# Patient Record
Sex: Female | Born: 1955
Health system: Southern US, Community
[De-identification: ages and names within clinical notes are randomized; demographics above are authoritative.]

## PROBLEM LIST (undated history)

## (undated) DIAGNOSIS — Z789 Other specified health status: Secondary | ICD-10-CM

## (undated) HISTORY — PX: APPENDECTOMY: SHX54

## (undated) HISTORY — PX: CARPAL TUNNEL RELEASE: SHX101

---

## 2002-08-28 ENCOUNTER — Encounter: Admission: RE | Admit: 2002-08-28 | Discharge: 2002-08-28 | Payer: Self-pay | Admitting: Family Medicine

## 2002-08-28 ENCOUNTER — Encounter: Payer: Self-pay | Admitting: Family Medicine

## 2003-09-25 ENCOUNTER — Encounter: Admission: RE | Admit: 2003-09-25 | Discharge: 2003-09-25 | Payer: Self-pay | Admitting: Family Medicine

## 2004-08-12 ENCOUNTER — Other Ambulatory Visit: Admission: RE | Admit: 2004-08-12 | Discharge: 2004-08-12 | Payer: Self-pay | Admitting: Family Medicine

## 2005-05-19 ENCOUNTER — Encounter: Admission: RE | Admit: 2005-05-19 | Discharge: 2005-05-19 | Payer: Self-pay | Admitting: Family Medicine

## 2007-12-07 ENCOUNTER — Encounter: Admission: RE | Admit: 2007-12-07 | Discharge: 2007-12-07 | Payer: Self-pay | Admitting: Family Medicine

## 2009-02-14 ENCOUNTER — Encounter: Admission: RE | Admit: 2009-02-14 | Discharge: 2009-02-14 | Payer: Self-pay | Admitting: Family Medicine

## 2010-02-24 ENCOUNTER — Other Ambulatory Visit: Admission: RE | Admit: 2010-02-24 | Discharge: 2010-02-24 | Payer: Self-pay | Admitting: Family Medicine

## 2011-03-10 ENCOUNTER — Other Ambulatory Visit (HOSPITAL_COMMUNITY)
Admission: RE | Admit: 2011-03-10 | Discharge: 2011-03-10 | Disposition: A | Discharge status: Home / Self Care | Payer: 59 | Source: Ambulatory Visit | Attending: Family Medicine | Admitting: Family Medicine

## 2011-03-10 ENCOUNTER — Other Ambulatory Visit: Payer: Self-pay | Admitting: Family Medicine

## 2011-03-10 DIAGNOSIS — Z124 Encounter for screening for malignant neoplasm of cervix: Secondary | ICD-10-CM | POA: Insufficient documentation

## 2011-04-30 ENCOUNTER — Other Ambulatory Visit: Payer: Self-pay | Admitting: Gastroenterology

## 2011-05-24 ENCOUNTER — Emergency Department (HOSPITAL_COMMUNITY): Payer: 59

## 2011-05-24 ENCOUNTER — Ambulatory Visit (HOSPITAL_COMMUNITY)
Admission: EM | Admit: 2011-05-24 | Discharge: 2011-05-25 | Disposition: A | Discharge status: Home / Self Care | Payer: 59 | Attending: General Surgery | Admitting: General Surgery

## 2011-05-24 DIAGNOSIS — K352 Acute appendicitis with generalized peritonitis, without abscess: Secondary | ICD-10-CM | POA: Insufficient documentation

## 2011-05-24 DIAGNOSIS — R112 Nausea with vomiting, unspecified: Secondary | ICD-10-CM | POA: Insufficient documentation

## 2011-05-24 DIAGNOSIS — R1031 Right lower quadrant pain: Secondary | ICD-10-CM | POA: Insufficient documentation

## 2011-05-24 DIAGNOSIS — Z01810 Encounter for preprocedural cardiovascular examination: Secondary | ICD-10-CM | POA: Insufficient documentation

## 2011-05-24 DIAGNOSIS — N200 Calculus of kidney: Secondary | ICD-10-CM | POA: Insufficient documentation

## 2011-05-24 DIAGNOSIS — D72829 Elevated white blood cell count, unspecified: Secondary | ICD-10-CM | POA: Insufficient documentation

## 2011-05-24 DIAGNOSIS — K35209 Acute appendicitis with generalized peritonitis, without abscess, unspecified as to perforation: Secondary | ICD-10-CM | POA: Insufficient documentation

## 2011-05-24 LAB — URINALYSIS, ROUTINE W REFLEX MICROSCOPIC
Bilirubin Urine: NEGATIVE
Glucose, UA: NEGATIVE mg/dL
Ketones, ur: NEGATIVE mg/dL
Nitrite: NEGATIVE
Protein, ur: NEGATIVE mg/dL
Specific Gravity, Urine: 1.021 (ref 1.005–1.030)
Urobilinogen, UA: 0.2 mg/dL (ref 0.0–1.0)
pH: 6.5 (ref 5.0–8.0)

## 2011-05-24 LAB — POCT I-STAT, CHEM 8
BUN: 13 mg/dL (ref 6–23)
Calcium, Ion: 1.15 mmol/L (ref 1.12–1.32)
Chloride: 104 mEq/L (ref 96–112)
Creatinine, Ser: 0.8 mg/dL (ref 0.50–1.10)
Glucose, Bld: 105 mg/dL — ABNORMAL HIGH (ref 70–99)
HCT: 40 % (ref 36.0–46.0)
Hemoglobin: 13.6 g/dL (ref 12.0–15.0)
Potassium: 4.1 mEq/L (ref 3.5–5.1)
Sodium: 137 mEq/L (ref 135–145)
TCO2: 23 mmol/L (ref 0–100)

## 2011-05-24 LAB — URINE MICROSCOPIC-ADD ON

## 2011-05-25 ENCOUNTER — Encounter (HOSPITAL_COMMUNITY): Payer: Self-pay

## 2011-05-25 ENCOUNTER — Other Ambulatory Visit (HOSPITAL_COMMUNITY): Payer: Self-pay

## 2011-05-25 ENCOUNTER — Other Ambulatory Visit (INDEPENDENT_AMBULATORY_CARE_PROVIDER_SITE_OTHER): Payer: Self-pay | Admitting: General Surgery

## 2011-05-25 DIAGNOSIS — K3580 Unspecified acute appendicitis: Secondary | ICD-10-CM

## 2011-05-25 DIAGNOSIS — R112 Nausea with vomiting, unspecified: Secondary | ICD-10-CM

## 2011-05-25 DIAGNOSIS — R1031 Right lower quadrant pain: Secondary | ICD-10-CM

## 2011-05-25 MED ORDER — IOHEXOL 300 MG/ML  SOLN
100.0000 mL | Freq: Once | INTRAMUSCULAR | Status: AC | PRN
  Administered 2011-05-25: 100 mL via INTRAVENOUS

## 2011-05-25 NOTE — Op Note (Signed)
NAMECRYSTLE, Kimberly Mason                ACCOUNT NO.:  [id number]  MEDICAL RECORD NO.:  [id number]  LOCATION:  WLED                         FACILITY:  [facility]  PHYSICIAN:  Angelia Mould. Derrell Lolling, M.D.DATE OF BIRTH:  1956/01/01  DATE OF PROCEDURE:  05/25/2011 DATE OF DISCHARGE:                              OPERATIVE REPORT   PREOPERATIVE DIAGNOSIS:  Acute appendicitis  POSTOPERATIVE DIAGNOSIS:  Acute suppurative appendicitis with microperforation.  OPERATION PERFORMED:  Laparoscopic appendectomy.  SURGEON:  Angelia Mould. Derrell Lolling, M.D.  OPERATIVE INDICATIONS:  This is a 55 year old Caucasian female in good general health.  She presented with a 24-hour history of abdominal pain, nausea and vomiting.  The pain was mostly localized to the right lower quadrant, although she was tender in the left lower quadrant.  CT scan showed inflamed appendix but no abscess or free fluid.  She is brought to operating room emergently.  OPERATIVE FINDINGS:  The patient had acute suppurative appendicitis.  It looked like there was one small microperforation and one tiny fleck of stool right next to the appendix.  There was some cloudy fluid in the pelvis but she did not have diffuse peritonitis.  The uterus, ovaries, rectum, sigmoid colon, terminal ileum, right colon and liver all looked normal otherwise.  OPERATIVE TECHNIQUE:  Following the induction of general endotracheal anesthesia, Foley catheter was inserted.  The patient's abdomen was prepped and draped in sterile fashion.  Surgical time-out was held identifying correct patient and correct procedure.  Intravenous antibiotics had been given within an hour of the surgery time.  A 0.5% Marcaine with epinephrine was used as local infiltration anesthetic.  A curved transverse incision was made at the superior rim of the umbilicus.  The fascia was incised in the midline.  The abdominal cavity was  entered under direct vision.  The 11-mm Hassan trocar was  inserted and secured with a pursestring suture with 0 Vicryl.  Pneumoperitoneum was created.  Video cam was inserted with visualization and findings as described above.  A 5-mm trocar was placed in the left lower quadrant and a 12-mm trocar placed in the suprapubic area.  We irrigated out the pelvis and right lower quadrant a little bit.  We identified the appendix and slowly teased it away from the terminal ileum to which it was acutely adherent.  We divided some of the lateral peritoneal attachments of the cecum which allowed [place] to mobilize the appendix and cecum medially.  We could then identify the base of the appendix and the appendiceal mesentery.  We divided the appendiceal mesentery and artery slowly using the harmonic scalpel.  We did this in several steps until we had completely skeletonized the appendix and can see its junction with the cecum.  We inserted an Endo-GIA stapler with a 2.5 mm staple load.  This was placed transversely across the base of the appendix, closed, held in place for 30 seconds and fired and removed.  This gave a good closure.  The appendix was placed in the specimen bag and removed. There were a few loose staples which were removed.  We checked the staple line several times and it looked very good.  There was no bleeding.  We then spent some time irrigating out the pelvis, right paracolic gutter and subphrenic space to make sure all irrigation fluid became completely clear.  There did not appear to be any exudate or purulent material and so we completed the operation.  The trocars were removed under direct vision.  There was no bleeding from trocar sites. The pneumoperitoneum was released.  The fascia at the umbilicus was closed with 0 Vicryl sutures.  The skin incisions were closed with subcuticular sutures of 4-0 Monocryl and Dermabond.  The patient was taken to the recovery room in stable condition.  Estimated blood loss was no more than 10 to 15  cc. Complications were none.  Sponge, needle and instrument counts were correct.     Angelia Mould. Derrell Lolling, M.D.     HMI/MEDQ  D:  05/25/2011  T:  05/25/2011  Job:  [id number]  cc:   L. Lupe Carney, M.D. Fax: [phone]  Electronically Signed by Claud Kelp M.D. on 05/25/2011 12:39:20 PM

## 2011-05-25 NOTE — H&P (Signed)
Kimberly Mason, LAINO                ACCOUNT NO.:  [id number]  MEDICAL RECORD NO.:  [id number]  LOCATION:  1517                         FACILITY:  [facility]  PHYSICIAN:  Angelia Mould. Kimberly Mason, M.D.DATE OF BIRTH:  02-Jan-1956  DATE OF ADMISSION:  05/24/2011 DATE OF DISCHARGE:                             HISTORY & PHYSICAL   REFERRING PHYSICIAN:  Juliet Rude. [name], MD  CHIEF COMPLAINT:  Abdominal pain and vomiting.  HISTORY OF PRESENT ILLNESS:  This is a healthy, 55 year old Caucasian female, referred to me by Dr. Benjiman Core in the [facility] Emergency Department.  She is presumed to have appendicitis.  The patient was well until Sunday night, June 24th, and she noted the onset of diffuse abdominal pain.  This lasted all night and kept her from sleeping.  She denied any nausea or vomiting or diarrhea at that night.  She went to work Monday morning.  The pain got worse and she went to Urgent Care on [address] and was then sent to the emergency room.  She said the pain is more intense now and is down the right lower quadrant.  She has been nauseated and has vomited 2 or 3 times since. She denies any diarrhea, fever, chills, or prior similar episodes.  The CT scan strongly suggests acute appendicitis with dilated, inflamed, thickened appendix, but no other abnormalities.  PAST HISTORY:  Cesarean section x2, lower midline incision, right carpal tunnel release.  CURRENT MEDICATIONS:  Vitamins.  DRUG ALLERGIES:  None known.  FAMILY HISTORY:  Mother living, has been treated for breast cancer, has hypertension.  Father deceased, had prostate cancer, but died of pneumonia, has complications of Guillain-Barre syndrome.  She had 3 sisters, one died of a car wreck, and one has lost her gallbladder and appendix, but otherwise healthy.  SOCIAL HISTORY:  She is married and they have 2 children.  She works as Engineer, site at [employer].  Her husband is with her tonight.   She smokes one-fourth pack of cigarettes per day.  She drinks 1-2 alcoholic beverages a day.  REVIEW OF SYSTEMS:  Ten 10 system review of systems is performed and is noncontributory except as described above.  PHYSICAL EXAMINATION:  GENERAL:  A healthy middle-aged woman, in only mild distress.  Cooperative and pleasant. VITAL SIGNS:  Initial temperature 101.2, heart rate 95, blood pressure 122/76, respiratory rate 16, oxygen saturation 98%. HEENT:  Eyes, sclerae clear.  Extraocular movements intact.  Ears, nose, mouth, throat, nose, lips, tongue, and oropharynx are without gross lesions. NECK:  Supple, nontender.  No mass.  No jugular venous distention. LUNGS:  Clear to auscultation.  No chest wall tenderness. HEART:  Regular rate and rhythm.  No murmur.  Radial, femoral, and dorsalis pedis pulses are palpable. BREASTS:  Not examined. ABDOMEN:  Soft and nondistended with the exception of the right lower quadrant where she is extremely tender with involuntary guarding, percussion tenderness and direct rebound.  She is slightly tender in the left lower quadrant.  Upper abdomen is soft.  The umbilicus looks normal.  There is a lower midline scar.  No hernias in the scar.  I do not really feel any masses. GENITOURINARY:  There is no inguinal hernia or mass detected when she is supine. EXTREMITIES:  She moves all 4 extremities well without pain or deformity. NEUROLOGIC:  No gross motor sensory deficits.  DATA REVIEWED:  CT scan as described above, strongly suggesting acute appendicitis.  Urinalysis has a negative microscopic.  Hemoglobin 13.6, potassium 4.1, glucose 105, creatinine 0.8.  ASSESSMENT: 1. Acute appendicitis.  No evidence for abscess. 2. Status post cesarean section x2, lower midline incision.  PLAN:  The patient has already been started on intravenous antibiotics and will be taken to operating room shortly for diagnostic laparoscopy and laparoscopic appendectomy.  I  have discussed the indication and details of surgery with the patient and her husband.  Risks and complications have been outlined, including not limited to bleeding, infection, conversion to open laparotomy, injury to adjacent organs such as the ureter, intestine, or bladder with major reconstructive surgery, wound healing problems such as hernia or infection, cardiac, pulmonary, and thromboembolic problems.  She seems to understand these issues well.  At this time, all of her questions are answered.  She is in full agreement with this plan.     Angelia Mould. Kimberly Mason, M.D.     HMI/MEDQ  D:  05/25/2011  T:  05/25/2011  Job:  [id number]  cc:   Dr. Particia Jasper at [facility]  Electronically Signed by Claud Kelp M.D. on 05/25/2011 12:40:08 PM

## 2011-09-08 ENCOUNTER — Other Ambulatory Visit: Payer: Self-pay | Admitting: Family Medicine

## 2011-09-08 DIAGNOSIS — Z1231 Encounter for screening mammogram for malignant neoplasm of breast: Secondary | ICD-10-CM

## 2011-09-29 ENCOUNTER — Ambulatory Visit
Admission: RE | Admit: 2011-09-29 | Discharge: 2011-09-29 | Disposition: A | Discharge status: Home / Self Care | Payer: 59 | Source: Ambulatory Visit | Attending: Family Medicine | Admitting: Family Medicine

## 2011-09-29 DIAGNOSIS — Z1231 Encounter for screening mammogram for malignant neoplasm of breast: Secondary | ICD-10-CM

## 2012-09-05 ENCOUNTER — Other Ambulatory Visit: Payer: Self-pay | Admitting: Family Medicine

## 2012-09-05 DIAGNOSIS — Z1231 Encounter for screening mammogram for malignant neoplasm of breast: Secondary | ICD-10-CM

## 2012-10-03 ENCOUNTER — Ambulatory Visit
Admission: RE | Admit: 2012-10-03 | Discharge: 2012-10-03 | Disposition: A | Discharge status: Home / Self Care | Payer: 59 | Source: Ambulatory Visit | Attending: Family Medicine | Admitting: Family Medicine

## 2012-10-03 DIAGNOSIS — Z1231 Encounter for screening mammogram for malignant neoplasm of breast: Secondary | ICD-10-CM

## 2013-10-05 ENCOUNTER — Other Ambulatory Visit: Payer: Self-pay

## 2013-10-05 DIAGNOSIS — Z1231 Encounter for screening mammogram for malignant neoplasm of breast: Secondary | ICD-10-CM

## 2013-11-06 ENCOUNTER — Ambulatory Visit
Admission: RE | Admit: 2013-11-06 | Discharge: 2013-11-06 | Disposition: A | Discharge status: Home / Self Care | Payer: 59 | Source: Ambulatory Visit

## 2013-11-06 DIAGNOSIS — Z1231 Encounter for screening mammogram for malignant neoplasm of breast: Secondary | ICD-10-CM

## 2014-10-17 ENCOUNTER — Other Ambulatory Visit (HOSPITAL_COMMUNITY)
Admission: RE | Admit: 2014-10-17 | Discharge: 2014-10-17 | Disposition: A | Discharge status: Home / Self Care | Payer: 59 | Source: Ambulatory Visit | Attending: Obstetrics & Gynecology | Admitting: Obstetrics & Gynecology

## 2014-10-17 ENCOUNTER — Other Ambulatory Visit: Payer: Self-pay | Admitting: Obstetrics & Gynecology

## 2014-10-17 DIAGNOSIS — Z1151 Encounter for screening for human papillomavirus (HPV): Secondary | ICD-10-CM | POA: Diagnosis present

## 2014-10-17 DIAGNOSIS — Z01419 Encounter for gynecological examination (general) (routine) without abnormal findings: Secondary | ICD-10-CM | POA: Diagnosis present

## 2014-10-18 LAB — CYTOLOGY - PAP

## 2015-04-11 ENCOUNTER — Other Ambulatory Visit: Payer: Self-pay

## 2015-04-11 DIAGNOSIS — Z1231 Encounter for screening mammogram for malignant neoplasm of breast: Secondary | ICD-10-CM

## 2015-04-17 ENCOUNTER — Ambulatory Visit
Admission: RE | Admit: 2015-04-17 | Discharge: 2015-04-17 | Disposition: A | Discharge status: Home / Self Care | Payer: 59 | Source: Ambulatory Visit

## 2015-04-17 DIAGNOSIS — Z1231 Encounter for screening mammogram for malignant neoplasm of breast: Secondary | ICD-10-CM

## 2016-03-16 ENCOUNTER — Other Ambulatory Visit: Payer: Self-pay

## 2016-03-16 DIAGNOSIS — Z1231 Encounter for screening mammogram for malignant neoplasm of breast: Secondary | ICD-10-CM

## 2016-04-20 ENCOUNTER — Ambulatory Visit: Payer: 59

## 2016-05-14 ENCOUNTER — Ambulatory Visit
Admission: RE | Admit: 2016-05-14 | Discharge: 2016-05-14 | Disposition: A | Discharge status: Home / Self Care | Payer: 59 | Source: Ambulatory Visit

## 2016-05-14 DIAGNOSIS — Z1231 Encounter for screening mammogram for malignant neoplasm of breast: Secondary | ICD-10-CM

## 2017-07-22 DIAGNOSIS — Z1159 Encounter for screening for other viral diseases: Secondary | ICD-10-CM | POA: Diagnosis not present

## 2017-07-22 DIAGNOSIS — Z23 Encounter for immunization: Secondary | ICD-10-CM | POA: Diagnosis not present

## 2017-07-22 DIAGNOSIS — Z0000 Encounter for general adult medical examination without abnormal findings: Secondary | ICD-10-CM | POA: Diagnosis not present

## 2017-07-22 DIAGNOSIS — Z13220 Encounter for screening for lipoid disorders: Secondary | ICD-10-CM | POA: Diagnosis not present

## 2017-07-25 ENCOUNTER — Other Ambulatory Visit: Payer: Self-pay | Admitting: Family Medicine

## 2017-07-25 DIAGNOSIS — Z1231 Encounter for screening mammogram for malignant neoplasm of breast: Secondary | ICD-10-CM

## 2017-08-09 ENCOUNTER — Ambulatory Visit: Payer: 59

## 2017-08-11 ENCOUNTER — Ambulatory Visit
Admission: RE | Admit: 2017-08-11 | Discharge: 2017-08-11 | Disposition: A | Discharge status: Home / Self Care | Payer: 59 | Source: Ambulatory Visit | Attending: Family Medicine | Admitting: Family Medicine

## 2017-08-11 DIAGNOSIS — Z1231 Encounter for screening mammogram for malignant neoplasm of breast: Secondary | ICD-10-CM | POA: Diagnosis not present

## 2017-08-22 DIAGNOSIS — H40033 Anatomical narrow angle, bilateral: Secondary | ICD-10-CM | POA: Diagnosis not present

## 2017-08-22 DIAGNOSIS — H04123 Dry eye syndrome of bilateral lacrimal glands: Secondary | ICD-10-CM | POA: Diagnosis not present

## 2017-11-03 DIAGNOSIS — Z01411 Encounter for gynecological examination (general) (routine) with abnormal findings: Secondary | ICD-10-CM | POA: Diagnosis not present

## 2017-11-03 DIAGNOSIS — N952 Postmenopausal atrophic vaginitis: Secondary | ICD-10-CM | POA: Diagnosis not present

## 2017-11-25 DIAGNOSIS — R05 Cough: Secondary | ICD-10-CM | POA: Diagnosis not present

## 2017-11-25 DIAGNOSIS — J069 Acute upper respiratory infection, unspecified: Secondary | ICD-10-CM | POA: Diagnosis not present

## 2018-03-14 DIAGNOSIS — M79641 Pain in right hand: Secondary | ICD-10-CM | POA: Diagnosis not present

## 2018-03-14 DIAGNOSIS — G5603 Carpal tunnel syndrome, bilateral upper limbs: Secondary | ICD-10-CM | POA: Diagnosis not present

## 2018-03-14 DIAGNOSIS — M79642 Pain in left hand: Secondary | ICD-10-CM | POA: Diagnosis not present

## 2018-03-14 DIAGNOSIS — M542 Cervicalgia: Secondary | ICD-10-CM | POA: Diagnosis not present

## 2018-03-29 DIAGNOSIS — G5603 Carpal tunnel syndrome, bilateral upper limbs: Secondary | ICD-10-CM | POA: Diagnosis not present

## 2018-03-29 DIAGNOSIS — G5621 Lesion of ulnar nerve, right upper limb: Secondary | ICD-10-CM | POA: Diagnosis not present

## 2018-05-10 DIAGNOSIS — M5412 Radiculopathy, cervical region: Secondary | ICD-10-CM | POA: Diagnosis not present

## 2018-05-10 DIAGNOSIS — M542 Cervicalgia: Secondary | ICD-10-CM | POA: Diagnosis not present

## 2018-06-29 DIAGNOSIS — Z23 Encounter for immunization: Secondary | ICD-10-CM | POA: Diagnosis not present

## 2018-07-24 DIAGNOSIS — M25562 Pain in left knee: Secondary | ICD-10-CM | POA: Diagnosis not present

## 2018-07-24 DIAGNOSIS — M25561 Pain in right knee: Secondary | ICD-10-CM | POA: Diagnosis not present

## 2018-08-02 ENCOUNTER — Ambulatory Visit: Payer: 59 | Admitting: Podiatry

## 2019-01-04 ENCOUNTER — Other Ambulatory Visit: Payer: Self-pay | Admitting: Family Medicine

## 2019-01-04 DIAGNOSIS — Z1231 Encounter for screening mammogram for malignant neoplasm of breast: Secondary | ICD-10-CM

## 2019-01-05 ENCOUNTER — Ambulatory Visit
Admission: RE | Admit: 2019-01-05 | Discharge: 2019-01-05 | Disposition: A | Discharge status: Home / Self Care | Payer: 59 | Source: Ambulatory Visit | Attending: Family Medicine | Admitting: Family Medicine

## 2019-01-05 DIAGNOSIS — Z1231 Encounter for screening mammogram for malignant neoplasm of breast: Secondary | ICD-10-CM

## 2019-04-20 ENCOUNTER — Other Ambulatory Visit: Payer: Self-pay | Admitting: Orthopedic Surgery

## 2019-05-03 ENCOUNTER — Other Ambulatory Visit: Payer: Self-pay

## 2019-05-03 ENCOUNTER — Encounter (HOSPITAL_BASED_OUTPATIENT_CLINIC_OR_DEPARTMENT_OTHER): Payer: Self-pay | Admitting: *Deleted

## 2019-05-07 ENCOUNTER — Other Ambulatory Visit (HOSPITAL_COMMUNITY)
Admission: RE | Admit: 2019-05-07 | Discharge: 2019-05-07 | Disposition: A | Discharge status: Home / Self Care | Payer: 59 | Source: Ambulatory Visit | Attending: Orthopedic Surgery | Admitting: Orthopedic Surgery

## 2019-05-07 DIAGNOSIS — Z1159 Encounter for screening for other viral diseases: Secondary | ICD-10-CM | POA: Insufficient documentation

## 2019-05-07 NOTE — Progress Notes (Signed)
Patient given Ensure presurgery drink with instruction to drink by 0615. Pt voiced understanding without questions.

## 2019-05-08 LAB — NOVEL CORONAVIRUS, NAA (HOSP ORDER, SEND-OUT TO REF LAB; TAT 18-24 HRS): SARS-CoV-2, NAA: NOT DETECTED

## 2019-05-10 ENCOUNTER — Ambulatory Visit (HOSPITAL_BASED_OUTPATIENT_CLINIC_OR_DEPARTMENT_OTHER): Payer: 59 | Admitting: Anesthesiology

## 2019-05-10 ENCOUNTER — Encounter (HOSPITAL_BASED_OUTPATIENT_CLINIC_OR_DEPARTMENT_OTHER): Payer: Self-pay

## 2019-05-10 ENCOUNTER — Ambulatory Visit (HOSPITAL_BASED_OUTPATIENT_CLINIC_OR_DEPARTMENT_OTHER)
Admission: RE | Admit: 2019-05-10 | Discharge: 2019-05-10 | Disposition: A | Discharge status: Home / Self Care | Payer: 59 | Attending: Orthopedic Surgery | Admitting: Orthopedic Surgery

## 2019-05-10 ENCOUNTER — Encounter (HOSPITAL_BASED_OUTPATIENT_CLINIC_OR_DEPARTMENT_OTHER)
Admission: RE | Disposition: A | Discharge status: Home / Self Care | Payer: Self-pay | Source: Home / Self Care | Attending: Orthopedic Surgery

## 2019-05-10 ENCOUNTER — Other Ambulatory Visit: Payer: Self-pay

## 2019-05-10 DIAGNOSIS — G5602 Carpal tunnel syndrome, left upper limb: Secondary | ICD-10-CM | POA: Insufficient documentation

## 2019-05-10 HISTORY — DX: Other specified health status: Z78.9

## 2019-05-10 HISTORY — PX: CARPAL TUNNEL RELEASE: SHX101

## 2019-05-10 SURGERY — CARPAL TUNNEL RELEASE
Anesthesia: Regional | Site: Hand | Laterality: Left

## 2019-05-10 MED ORDER — SCOPOLAMINE 1 MG/3DAYS TD PT72: 1.0000 | MEDICATED_PATCH | Freq: Once | TRANSDERMAL | Status: DC | PRN

## 2019-05-10 MED ORDER — ONDANSETRON HCL 4 MG/2ML IJ SOLN: 4.0000 mg | Freq: Once | INTRAMUSCULAR | Status: DC | PRN

## 2019-05-10 MED ORDER — OXYCODONE HCL 5 MG PO TABS: 5.0000 mg | ORAL_TABLET | Freq: Once | ORAL | Status: DC | PRN

## 2019-05-10 MED ORDER — FENTANYL CITRATE (PF) 100 MCG/2ML IJ SOLN
50.0000 ug | INTRAMUSCULAR | Status: DC | PRN
  Administered 2019-05-10 (×2): 50 ug via INTRAVENOUS

## 2019-05-10 MED ORDER — PROPOFOL 10 MG/ML IV BOLUS
INTRAVENOUS | Status: DC | PRN
  Administered 2019-05-10: 20 mg via INTRAVENOUS

## 2019-05-10 MED ORDER — SUCCINYLCHOLINE CHLORIDE 200 MG/10ML IV SOSY
PREFILLED_SYRINGE | INTRAVENOUS | Status: AC
  Filled 2019-05-10: qty 10

## 2019-05-10 MED ORDER — PHENYLEPHRINE 40 MCG/ML (10ML) SYRINGE FOR IV PUSH (FOR BLOOD PRESSURE SUPPORT)
PREFILLED_SYRINGE | INTRAVENOUS | Status: AC
  Filled 2019-05-10: qty 10

## 2019-05-10 MED ORDER — MIDAZOLAM HCL 2 MG/2ML IJ SOLN
1.0000 mg | INTRAMUSCULAR | Status: DC | PRN
  Administered 2019-05-10 (×2): 1 mg via INTRAVENOUS

## 2019-05-10 MED ORDER — LACTATED RINGERS IV SOLN
INTRAVENOUS | Status: DC
  Administered 2019-05-10 (×2): via INTRAVENOUS

## 2019-05-10 MED ORDER — 0.9 % SODIUM CHLORIDE (POUR BTL) OPTIME
TOPICAL | Status: DC | PRN
  Administered 2019-05-10: 11:00:00 1000 mL

## 2019-05-10 MED ORDER — BUPIVACAINE HCL (PF) 0.25 % IJ SOLN
INTRAMUSCULAR | Status: DC | PRN
  Administered 2019-05-10: 10 mL

## 2019-05-10 MED ORDER — LIDOCAINE 2% (20 MG/ML) 5 ML SYRINGE
INTRAMUSCULAR | Status: AC
  Filled 2019-05-10: qty 5

## 2019-05-10 MED ORDER — CEFAZOLIN SODIUM-DEXTROSE 2-4 GM/100ML-% IV SOLN
2.0000 g | INTRAVENOUS | Status: AC
  Administered 2019-05-10: 2 g via INTRAVENOUS

## 2019-05-10 MED ORDER — ONDANSETRON HCL 4 MG/2ML IJ SOLN
INTRAMUSCULAR | Status: AC
  Filled 2019-05-10: qty 2

## 2019-05-10 MED ORDER — FENTANYL CITRATE (PF) 100 MCG/2ML IJ SOLN
INTRAMUSCULAR | Status: AC
  Filled 2019-05-10: qty 2

## 2019-05-10 MED ORDER — MIDAZOLAM HCL 2 MG/2ML IJ SOLN
INTRAMUSCULAR | Status: AC
  Filled 2019-05-10: qty 2

## 2019-05-10 MED ORDER — LIDOCAINE HCL (PF) 0.5 % IJ SOLN
INTRAMUSCULAR | Status: DC | PRN
  Administered 2019-05-10: 30 mL via INTRAVENOUS

## 2019-05-10 MED ORDER — CHLORHEXIDINE GLUCONATE 4 % EX LIQD: 60.0000 mL | Freq: Once | CUTANEOUS | Status: DC

## 2019-05-10 MED ORDER — FENTANYL CITRATE (PF) 100 MCG/2ML IJ SOLN: 25.0000 ug | INTRAMUSCULAR | Status: DC | PRN

## 2019-05-10 MED ORDER — CEFAZOLIN SODIUM-DEXTROSE 2-4 GM/100ML-% IV SOLN
INTRAVENOUS | Status: AC
  Filled 2019-05-10: qty 100

## 2019-05-10 MED ORDER — HYDROCODONE-ACETAMINOPHEN 5-325 MG PO TABS: ORAL_TABLET | ORAL | 0 refills | Status: AC

## 2019-05-10 MED ORDER — ONDANSETRON HCL 4 MG/2ML IJ SOLN
INTRAMUSCULAR | Status: DC | PRN
  Administered 2019-05-10: 4 mg via INTRAVENOUS

## 2019-05-10 MED ORDER — OXYCODONE HCL 5 MG/5ML PO SOLN: 5.0000 mg | Freq: Once | ORAL | Status: DC | PRN

## 2019-05-10 MED ORDER — EPHEDRINE 5 MG/ML INJ
INTRAVENOUS | Status: AC
  Filled 2019-05-10: qty 10

## 2019-05-10 SURGICAL SUPPLY — 37 items
APL PRP STRL LF DISP 70% ISPRP (MISCELLANEOUS) ×1
BANDAGE ACE 3X5.8 VEL STRL LF (GAUZE/BANDAGES/DRESSINGS) ×2 IMPLANT
BLADE SURG 15 STRL LF DISP TIS (BLADE) ×2 IMPLANT
BLADE SURG 15 STRL SS (BLADE) ×4
BNDG CMPR 9X4 STRL LF SNTH (GAUZE/BANDAGES/DRESSINGS) ×1
BNDG ELASTIC 2X5.8 VLCR STR LF (GAUZE/BANDAGES/DRESSINGS) ×1 IMPLANT
BNDG ESMARK 4X9 LF (GAUZE/BANDAGES/DRESSINGS) ×1 IMPLANT
BNDG GAUZE ELAST 4 BULKY (GAUZE/BANDAGES/DRESSINGS) ×2 IMPLANT
CHLORAPREP W/TINT 26 (MISCELLANEOUS) ×2 IMPLANT
CORD BIPOLAR FORCEPS 12FT (ELECTRODE) ×2 IMPLANT
COVER BACK TABLE REUSABLE LG (DRAPES) ×2 IMPLANT
COVER MAYO STAND REUSABLE (DRAPES) ×2 IMPLANT
COVER WAND RF STERILE (DRAPES) IMPLANT
CUFF TOURN SGL QUICK 18X4 (TOURNIQUET CUFF) ×2 IMPLANT
DRAPE EXTREMITY T 121X128X90 (DISPOSABLE) ×2 IMPLANT
DRAPE SURG 17X23 STRL (DRAPES) ×2 IMPLANT
DRSG PAD ABDOMINAL 8X10 ST (GAUZE/BANDAGES/DRESSINGS) ×2 IMPLANT
GAUZE SPONGE 4X4 12PLY STRL (GAUZE/BANDAGES/DRESSINGS) ×2 IMPLANT
GAUZE XEROFORM 1X8 LF (GAUZE/BANDAGES/DRESSINGS) ×2 IMPLANT
GLOVE BIO SURGEON STRL SZ7.5 (GLOVE) ×2 IMPLANT
GLOVE BIOGEL PI IND STRL 8 (GLOVE) ×1 IMPLANT
GLOVE BIOGEL PI INDICATOR 8 (GLOVE) ×1
GOWN STRL REUS W/ TWL LRG LVL3 (GOWN DISPOSABLE) ×1 IMPLANT
GOWN STRL REUS W/TWL LRG LVL3 (GOWN DISPOSABLE) ×2
GOWN STRL REUS W/TWL XL LVL3 (GOWN DISPOSABLE) ×2 IMPLANT
NDL HYPO 25X1 1.5 SAFETY (NEEDLE) ×1 IMPLANT
NEEDLE HYPO 25X1 1.5 SAFETY (NEEDLE) ×2 IMPLANT
NS IRRIG 1000ML POUR BTL (IV SOLUTION) ×2 IMPLANT
PACK BASIN DAY SURGERY FS (CUSTOM PROCEDURE TRAY) ×2 IMPLANT
PADDING CAST ABS 4INX4YD NS (CAST SUPPLIES) ×1
PADDING CAST ABS COTTON 4X4 ST (CAST SUPPLIES) ×1 IMPLANT
STOCKINETTE 4X48 STRL (DRAPES) ×2 IMPLANT
SUT ETHILON 4 0 PS 2 18 (SUTURE) ×2 IMPLANT
SYR BULB 3OZ (MISCELLANEOUS) ×2 IMPLANT
SYR CONTROL 10ML LL (SYRINGE) ×2 IMPLANT
TOWEL GREEN STERILE FF (TOWEL DISPOSABLE) ×4 IMPLANT
UNDERPAD 30X30 (UNDERPADS AND DIAPERS) ×2 IMPLANT

## 2019-05-10 NOTE — Discharge Instructions (Addendum)

## 2019-05-10 NOTE — Anesthesia Postprocedure Evaluation (Signed)
Anesthesia Post Note  Patient: Addilyn Satterwhite  Procedure(s) Performed: CARPAL TUNNEL RELEASE LEFT (Left Hand)     Patient location during evaluation: PACU Anesthesia Type: Bier Block Level of consciousness: awake and alert Pain management: pain level controlled Vital Signs Assessment: post-procedure vital signs reviewed and stable Respiratory status: spontaneous breathing, nonlabored ventilation and respiratory function stable Cardiovascular status: blood pressure returned to baseline and stable Postop Assessment: no apparent nausea or vomiting Anesthetic complications: no    Last Vitals:  Vitals:   05/10/19 1130 05/10/19 1148  BP: (!) 162/85 (!) 155/81  Pulse:  (!) 42  Resp:  16  Temp:  36.4 C  SpO2:  100%    Last Pain:  Vitals:   05/10/19 1148  TempSrc: Oral  PainSc: 0-No pain                 Lidia Collum

## 2019-05-10 NOTE — H&P (Signed)
Kimberly Mason is an 63 y.o. female.   Chief Complaint: left carpal tunnel syndrome HPI: 63 yo female with numbness and tingling left hand.  Positive nerve conduction studies.  Some relief with injection.  She wishes to have a carpal tunnel release.  Allergies: No Known Allergies  Past Medical History:  Diagnosis Date  . Medical history non-contributory     Past Surgical History:  Procedure Laterality Date  . APPENDECTOMY    . CARPAL TUNNEL RELEASE Right   . CESAREAN SECTION      Family History: Family History  Problem Relation Age of Onset  . Breast cancer Mother   . Breast cancer Maternal Grandmother     Social History:   reports that she has never smoked. She has never used smokeless tobacco. She reports that she does not use drugs. No history on file for alcohol.  Medications: Medications Prior to Admission  Medication Sig Dispense Refill  . glucosamine-chondroitin 500-400 MG tablet Take 1 tablet by mouth 3 (three) times daily.    . Multiple Vitamin (MULTIVITAMIN) tablet Take 1 tablet by mouth daily.    . Omega-3 Fatty Acids (FISH OIL) 1200 MG CAPS Take 2,400 mg by mouth.      No results found for this or any previous visit (from the past 48 hour(s)).  No results found.   A comprehensive review of systems was negative.  Height 5\' 4"  (1.626 m), weight 68 kg.  General appearance: alert, cooperative and appears stated age Head: Normocephalic, without obvious abnormality, atraumatic Neck: supple, symmetrical, trachea midline Cardio: regular rate and rhythm Resp: clear to auscultation bilaterally Extremities: Intact sensation and capillary refill all digits.  +epl/fpl/io.  No wounds.  Pulses: 2+ and symmetric Skin: Skin color, texture, turgor normal. No rashes or lesions Neurologic: Grossly normal Incision/Wound: none  Assessment/Plan Left carpal tunnel syndrome.  Non operative and operative treatment options have been discussed with the patient and patient  wishes to proceed with operative treatment. Risks, benefits, and alternatives of surgery have been discussed and the patient agrees with the plan of care.   Leanora Cover 05/10/2019, 8:42 AM

## 2019-05-10 NOTE — Anesthesia Preprocedure Evaluation (Addendum)
Anesthesia Evaluation  Patient identified by MRN, date of birth, ID band Patient awake    Reviewed: Allergy & Precautions, NPO status , Patient's Chart, lab work & pertinent test results  History of Anesthesia Complications Negative for: history of anesthetic complications  Airway Mallampati: II  TM Distance: >3 FB Neck ROM: Full    Dental   Pulmonary neg pulmonary ROS,    Pulmonary exam normal        Cardiovascular negative cardio ROS Normal cardiovascular exam     Neuro/Psych negative neurological ROS  negative psych ROS   GI/Hepatic negative GI ROS, Neg liver ROS,   Endo/Other  negative endocrine ROS  Renal/GU negative Renal ROS  negative genitourinary   Musculoskeletal negative musculoskeletal ROS (+)   Abdominal   Peds  Hematology negative hematology ROS (+)   Anesthesia Other Findings   Reproductive/Obstetrics                            Anesthesia Physical Anesthesia Plan  ASA: I  Anesthesia Plan: Bier Block and Bier Block-LIDOCAINE ONLY   Post-op Pain Management:    Induction:   PONV Risk Score and Plan: 2 and Ondansetron, Propofol infusion and Treatment may vary due to age or medical condition  Airway Management Planned: Natural Airway, Nasal Cannula and Simple Face Mask  Additional Equipment: None  Intra-op Plan:   Post-operative Plan:   Informed Consent: I have reviewed the patients History and Physical, chart, labs and discussed the procedure including the risks, benefits and alternatives for the proposed anesthesia with the patient or authorized representative who has indicated his/her understanding and acceptance.       Plan Discussed with:   Anesthesia Plan Comments:        Anesthesia Quick Evaluation

## 2019-05-10 NOTE — Op Note (Signed)
05/10/2019 [facility]                              OPERATIVE REPORT   PREOPERATIVE DIAGNOSIS:  Left carpal tunnel syndrome.  POSTOPERATIVE DIAGNOSIS:  Left carpal tunnel syndrome.  PROCEDURE:  Left carpal tunnel release.  SURGEON:  Betha LoaKevin Lamark Schue, MD  ASSISTANT:  none.  ANESTHESIA: Bier block with sedation  IV FLUIDS:  Per anesthesia flow sheet.  ESTIMATED BLOOD LOSS:  Minimal.  COMPLICATIONS:  None.  SPECIMENS:  None.  TOURNIQUET TIME:    Total Tourniquet Time Documented: Forearm (Left) - 39 minutes Total: Forearm (Left) - 39 minutes   DISPOSITION:  Stable to PACU.  LOCATION: [facility]  INDICATIONS:  63 yo female with numbness and tingling left hand.  Positive nerve conduction studies.  She wishes to have a carpal tunnel release for management of her symptoms.  Risks, benefits and alternatives of surgery were discussed including the risk of blood loss; infection; damage to nerves, vessels, tendons, ligaments, bone; failure of surgery; need for additional surgery; complications with wound healing; continued pain; recurrence of carpal tunnel syndrome; and damage to motor branch. She voiced understanding of these risks and elected to proceed.   OPERATIVE COURSE:  After being identified preoperatively by myself, the patient and I agreed upon the procedure and site of procedure.  The surgical site was marked.  The risks, benefits, and alternatives of the surgery were reviewed and she wished to proceed.  Surgical consent had been signed.  She was given IV Ancef as preoperative antibiotic prophylaxis.  She was transferred to the operating room and placed on the operating room table in supine position with the Left upper extremity on an armboard.  Bier block anesthesia was induced by the anesthesiologist.  Left upper extremity was prepped and draped in normal sterile orthopaedic fashion.  A surgical pause was performed between the surgeons, anesthesia, and  operating room staff, and all were in agreement as to the patient, procedure, and site of procedure.  Tourniquet at the proximal aspect of the forearm had been inflated for the Bier block  Incision was made over the transverse carpal ligament and carried into the subcutaneous tissues by spreading technique.  Bipolar electrocautery was used to obtain hemostasis.  The palmar fascia was sharply incised.  The median nerve was initially retracted ulnarly and then radially.  There was an accessory branch of nerve noted.  The transverse carpal ligament was identified and sharply incised.  It was incised distally first.  Care was taken to ensure complete decompression distally.  It was then incised proximally.  The distal forearm fascia was tight and visualization was inadequate.  The incision was extended proximally to allow better visualization and prevent nerve damage.  Scissors were used to split the distal aspect of the volar antebrachial fascia.   The nerve was examined.  It was flattened and hyperemic. and It was adherent to the radial leaflet.  The motor branch was identified and was intact. The common digital branches of the nerve were identified and were intact. The accessory nerve branch appeared to be a communication from the ulnar nerve to the median nerve and was intact.  The wound was copiously irrigated with sterile saline.  It was then closed with 4-0 nylon in a horizontal mattress fashion.  It was injected with 0.25% plain Marcaine to aid in postoperative analgesia.  It was dressed with sterile Xeroform, 4x4s,  an ABD, and wrapped with Kerlix and an Ace bandage.  Tourniquet was deflated at 39 minutes.  Fingertips were pink with brisk capillary refill after deflation of the tourniquet.  Operative drapes were broken down.  The patient was awoken from anesthesia safely.  She was transferred back to stretcher and taken to the PACU in stable condition.  I will see her back in the office in 1 week for  postoperative followup.  I will give her a prescription for Norco 5/325 1-2 tabs PO q6 hours prn pain, dispense # 20.    Leanora Cover, MD Electronically signed, 05/10/19

## 2019-05-10 NOTE — Transfer of Care (Signed)
Immediate Anesthesia Transfer of Care Note  Patient: Kimberly Mason  Procedure(s) Performed: CARPAL TUNNEL RELEASE LEFT (Left Hand)  Patient Location: PACU  Anesthesia Type:MAC and Bier block  Level of Consciousness: awake, alert  and oriented  Airway & Oxygen Therapy: Patient Spontanous Breathing and Patient connected to face mask oxygen  Post-op Assessment: Report given to RN and Post -op Vital signs reviewed and stable  Post vital signs: Reviewed and stable  Last Vitals:  Vitals Value Taken Time  BP    Temp    Pulse    Resp    SpO2      Last Pain:  Vitals:   05/10/19 0856  TempSrc: Oral  PainSc: 0-No pain         Complications: No apparent anesthesia complications

## 2019-05-11 ENCOUNTER — Encounter (HOSPITAL_BASED_OUTPATIENT_CLINIC_OR_DEPARTMENT_OTHER): Payer: Self-pay | Admitting: Orthopedic Surgery

## 2020-04-18 ENCOUNTER — Other Ambulatory Visit: Payer: Self-pay | Admitting: Family Medicine

## 2020-04-18 DIAGNOSIS — Z1231 Encounter for screening mammogram for malignant neoplasm of breast: Secondary | ICD-10-CM

## 2020-04-22 ENCOUNTER — Ambulatory Visit
Admission: RE | Admit: 2020-04-22 | Discharge: 2020-04-22 | Disposition: A | Discharge status: Home / Self Care | Payer: 59 | Source: Ambulatory Visit | Attending: Family Medicine | Admitting: Family Medicine

## 2020-04-22 ENCOUNTER — Other Ambulatory Visit: Payer: Self-pay

## 2020-04-22 DIAGNOSIS — Z1231 Encounter for screening mammogram for malignant neoplasm of breast: Secondary | ICD-10-CM

## 2021-01-21 ENCOUNTER — Other Ambulatory Visit: Payer: Self-pay | Admitting: Nurse Practitioner

## 2021-01-21 DIAGNOSIS — Z780 Asymptomatic menopausal state: Secondary | ICD-10-CM

## 2021-06-29 DIAGNOSIS — L237 Allergic contact dermatitis due to plants, except food: Secondary | ICD-10-CM | POA: Diagnosis not present

## 2021-08-20 ENCOUNTER — Other Ambulatory Visit: Payer: Self-pay | Admitting: Family Medicine

## 2021-08-20 DIAGNOSIS — Z1231 Encounter for screening mammogram for malignant neoplasm of breast: Secondary | ICD-10-CM

## 2021-08-21 ENCOUNTER — Ambulatory Visit
Admission: RE | Admit: 2021-08-21 | Discharge: 2021-08-21 | Disposition: A | Discharge status: Home / Self Care | Payer: Medicare HMO | Source: Ambulatory Visit | Attending: Family Medicine | Admitting: Family Medicine

## 2021-08-21 ENCOUNTER — Other Ambulatory Visit: Payer: Self-pay

## 2021-08-21 DIAGNOSIS — Z1231 Encounter for screening mammogram for malignant neoplasm of breast: Secondary | ICD-10-CM | POA: Diagnosis not present

## 2021-10-13 DIAGNOSIS — Z0000 Encounter for general adult medical examination without abnormal findings: Secondary | ICD-10-CM | POA: Diagnosis not present

## 2022-01-19 DIAGNOSIS — Z85828 Personal history of other malignant neoplasm of skin: Secondary | ICD-10-CM | POA: Diagnosis not present

## 2022-01-19 DIAGNOSIS — D692 Other nonthrombocytopenic purpura: Secondary | ICD-10-CM | POA: Diagnosis not present

## 2022-01-19 DIAGNOSIS — L570 Actinic keratosis: Secondary | ICD-10-CM | POA: Diagnosis not present

## 2022-01-19 DIAGNOSIS — L814 Other melanin hyperpigmentation: Secondary | ICD-10-CM | POA: Diagnosis not present

## 2022-01-19 DIAGNOSIS — L578 Other skin changes due to chronic exposure to nonionizing radiation: Secondary | ICD-10-CM | POA: Diagnosis not present

## 2022-01-20 DIAGNOSIS — R030 Elevated blood-pressure reading, without diagnosis of hypertension: Secondary | ICD-10-CM | POA: Diagnosis not present

## 2022-01-20 DIAGNOSIS — N952 Postmenopausal atrophic vaginitis: Secondary | ICD-10-CM | POA: Diagnosis not present

## 2022-01-20 DIAGNOSIS — Z01419 Encounter for gynecological examination (general) (routine) without abnormal findings: Secondary | ICD-10-CM | POA: Diagnosis not present

## 2022-01-20 DIAGNOSIS — Z1211 Encounter for screening for malignant neoplasm of colon: Secondary | ICD-10-CM | POA: Diagnosis not present

## 2022-01-20 DIAGNOSIS — Z780 Asymptomatic menopausal state: Secondary | ICD-10-CM | POA: Diagnosis not present

## 2022-04-05 ENCOUNTER — Other Ambulatory Visit: Payer: Self-pay | Admitting: Nurse Practitioner

## 2022-04-05 DIAGNOSIS — Z780 Asymptomatic menopausal state: Secondary | ICD-10-CM

## 2022-05-11 DIAGNOSIS — H5203 Hypermetropia, bilateral: Secondary | ICD-10-CM | POA: Diagnosis not present

## 2022-05-14 DIAGNOSIS — Z0100 Encounter for examination of eyes and vision without abnormal findings: Secondary | ICD-10-CM | POA: Diagnosis not present

## 2022-06-17 DIAGNOSIS — M25561 Pain in right knee: Secondary | ICD-10-CM | POA: Diagnosis not present

## 2022-06-17 DIAGNOSIS — M25562 Pain in left knee: Secondary | ICD-10-CM | POA: Diagnosis not present

## 2022-06-17 DIAGNOSIS — M545 Low back pain, unspecified: Secondary | ICD-10-CM | POA: Diagnosis not present

## 2022-06-18 DIAGNOSIS — I10 Essential (primary) hypertension: Secondary | ICD-10-CM | POA: Diagnosis not present

## 2022-07-19 DIAGNOSIS — I10 Essential (primary) hypertension: Secondary | ICD-10-CM | POA: Diagnosis not present

## 2022-07-20 DIAGNOSIS — Z1211 Encounter for screening for malignant neoplasm of colon: Secondary | ICD-10-CM | POA: Diagnosis not present

## 2022-09-21 ENCOUNTER — Ambulatory Visit
Admission: RE | Admit: 2022-09-21 | Discharge: 2022-09-21 | Disposition: A | Discharge status: Home / Self Care | Payer: Medicare HMO | Source: Ambulatory Visit | Attending: Nurse Practitioner | Admitting: Nurse Practitioner

## 2022-09-21 DIAGNOSIS — Z780 Asymptomatic menopausal state: Secondary | ICD-10-CM | POA: Diagnosis not present

## 2022-10-25 ENCOUNTER — Other Ambulatory Visit: Payer: Self-pay | Admitting: Family Medicine

## 2022-10-25 DIAGNOSIS — Z1239 Encounter for other screening for malignant neoplasm of breast: Secondary | ICD-10-CM | POA: Diagnosis not present

## 2022-10-25 DIAGNOSIS — Z0000 Encounter for general adult medical examination without abnormal findings: Secondary | ICD-10-CM | POA: Diagnosis not present

## 2022-10-25 DIAGNOSIS — Z23 Encounter for immunization: Secondary | ICD-10-CM | POA: Diagnosis not present

## 2022-10-25 DIAGNOSIS — I10 Essential (primary) hypertension: Secondary | ICD-10-CM | POA: Diagnosis not present

## 2022-10-25 DIAGNOSIS — Z1231 Encounter for screening mammogram for malignant neoplasm of breast: Secondary | ICD-10-CM

## 2022-10-25 DIAGNOSIS — M129 Arthropathy, unspecified: Secondary | ICD-10-CM | POA: Diagnosis not present

## 2022-10-25 DIAGNOSIS — Z13220 Encounter for screening for lipoid disorders: Secondary | ICD-10-CM | POA: Diagnosis not present

## 2022-12-17 ENCOUNTER — Ambulatory Visit
Admission: RE | Admit: 2022-12-17 | Discharge: 2022-12-17 | Disposition: A | Discharge status: Home / Self Care | Payer: Medicare HMO | Source: Ambulatory Visit | Attending: Family Medicine | Admitting: Family Medicine

## 2022-12-17 DIAGNOSIS — Z1231 Encounter for screening mammogram for malignant neoplasm of breast: Secondary | ICD-10-CM

## 2022-12-24 DIAGNOSIS — R69 Illness, unspecified: Secondary | ICD-10-CM | POA: Diagnosis not present

## 2023-01-21 DIAGNOSIS — N952 Postmenopausal atrophic vaginitis: Secondary | ICD-10-CM | POA: Diagnosis not present

## 2023-02-09 DIAGNOSIS — R69 Illness, unspecified: Secondary | ICD-10-CM | POA: Diagnosis not present

## 2023-02-16 DIAGNOSIS — Z85828 Personal history of other malignant neoplasm of skin: Secondary | ICD-10-CM | POA: Diagnosis not present

## 2023-02-16 DIAGNOSIS — D235 Other benign neoplasm of skin of trunk: Secondary | ICD-10-CM | POA: Diagnosis not present

## 2023-02-16 DIAGNOSIS — L814 Other melanin hyperpigmentation: Secondary | ICD-10-CM | POA: Diagnosis not present

## 2023-02-16 DIAGNOSIS — L819 Disorder of pigmentation, unspecified: Secondary | ICD-10-CM | POA: Diagnosis not present

## 2023-02-16 DIAGNOSIS — L570 Actinic keratosis: Secondary | ICD-10-CM | POA: Diagnosis not present

## 2023-04-26 DIAGNOSIS — I10 Essential (primary) hypertension: Secondary | ICD-10-CM | POA: Diagnosis not present

## 2023-04-26 DIAGNOSIS — R809 Proteinuria, unspecified: Secondary | ICD-10-CM | POA: Diagnosis not present

## 2023-06-20 DIAGNOSIS — R69 Illness, unspecified: Secondary | ICD-10-CM | POA: Diagnosis not present

## 2023-07-18 ENCOUNTER — Other Ambulatory Visit (HOSPITAL_COMMUNITY): Payer: Self-pay | Admitting: Medical

## 2023-07-18 ENCOUNTER — Ambulatory Visit (HOSPITAL_COMMUNITY)
Admission: RE | Admit: 2023-07-18 | Discharge: 2023-07-18 | Disposition: A | Discharge status: Home / Self Care | Payer: Medicare HMO | Source: Ambulatory Visit | Attending: Cardiology | Admitting: Cardiology

## 2023-07-18 DIAGNOSIS — M79662 Pain in left lower leg: Secondary | ICD-10-CM | POA: Diagnosis not present

## 2023-07-18 DIAGNOSIS — M79605 Pain in left leg: Secondary | ICD-10-CM

## 2023-07-18 DIAGNOSIS — M25562 Pain in left knee: Secondary | ICD-10-CM | POA: Diagnosis not present

## 2023-09-11 IMAGING — MG MM DIGITAL SCREENING BILAT W/ TOMO AND CAD
8 series · 8 of 24 positions shown · non-contrast
Comparison: Previous exam(s).

CLINICAL DATA: Screening.

EXAM:
DIGITAL SCREENING BILATERAL MAMMOGRAM WITH TOMOSYNTHESIS AND CAD
TECHNIQUE: Bilateral screening digital craniocaudal and mediolateral oblique
mammograms were obtained. Bilateral screening digital breast
tomosynthesis was performed. The images were evaluated with
computer-aided detection.

[L CC synth-2D]
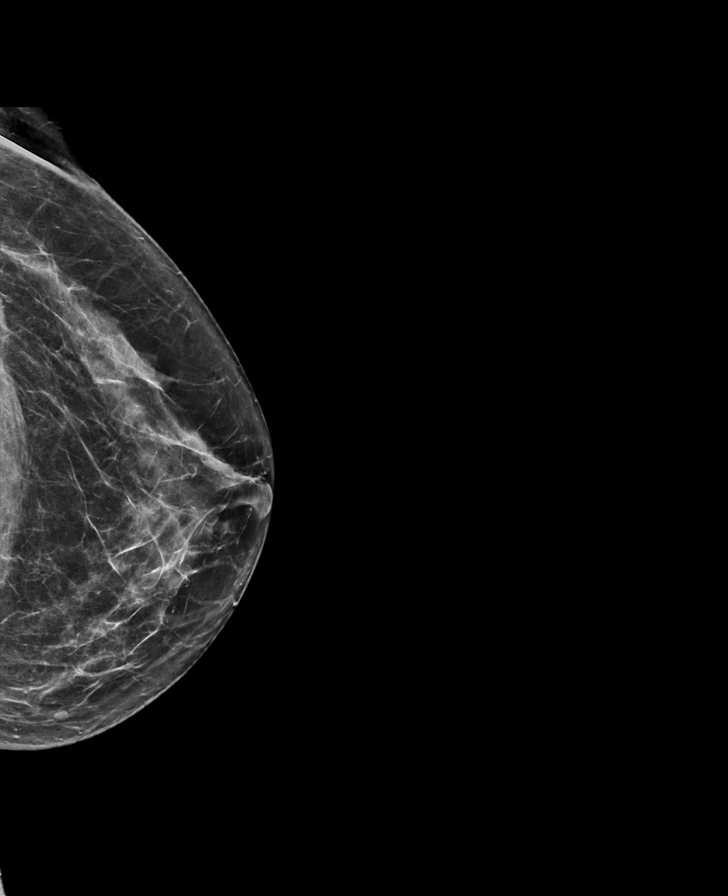

[R CC synth-2D]
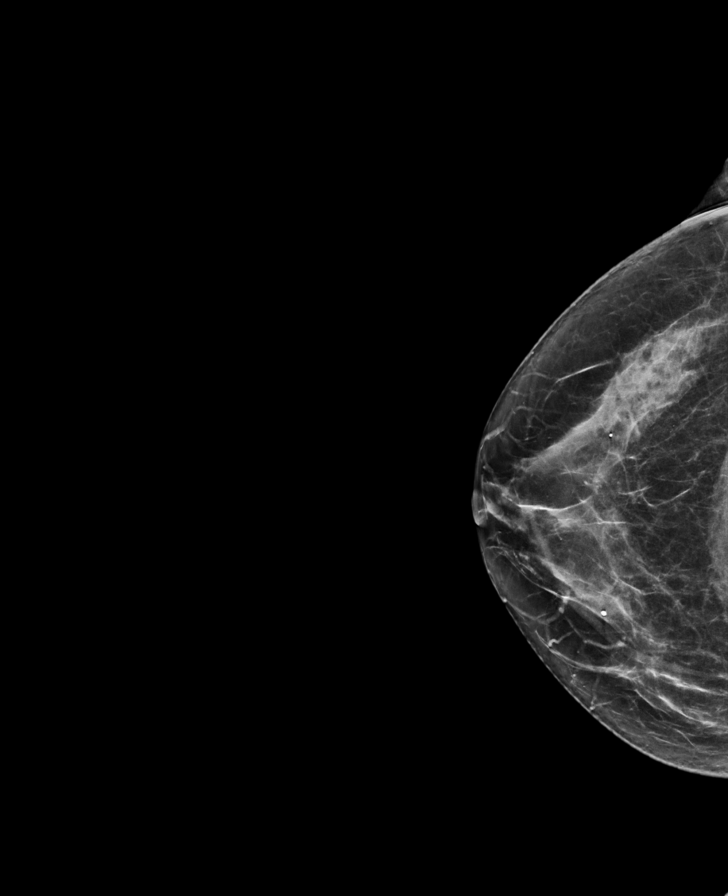

[L MLO synth-2D]
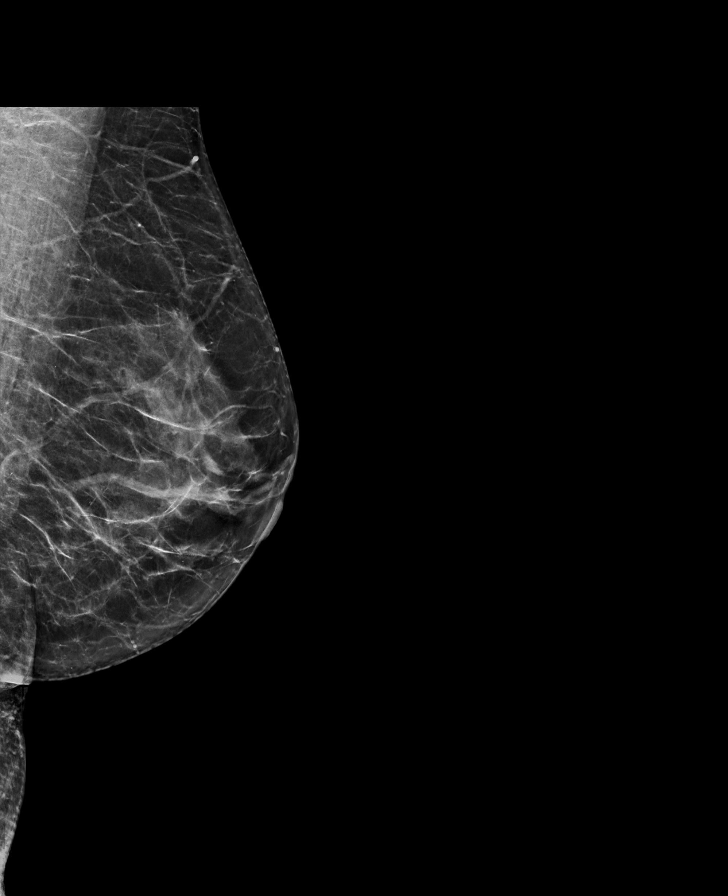

[R MLO synth-2D]
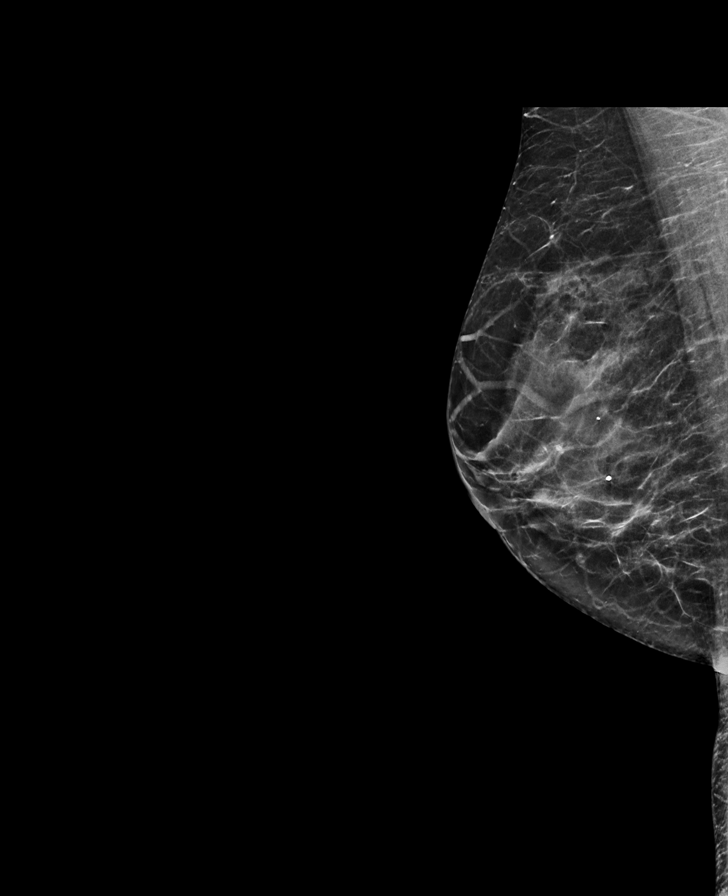

[R CC tomo · tomo slice 31/62.0]
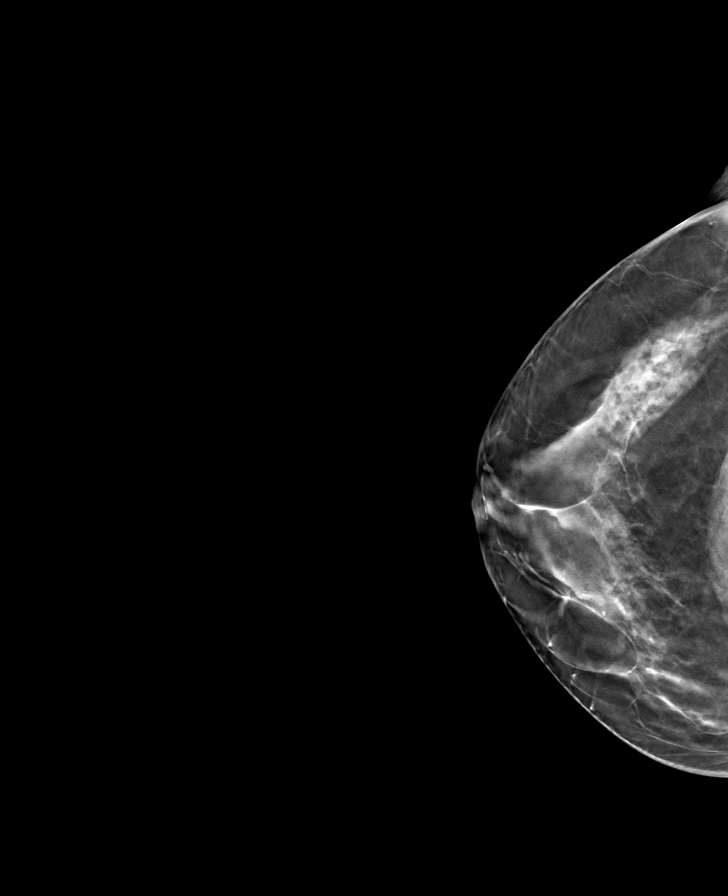

[R MLO tomo · tomo slice 30/59.0]
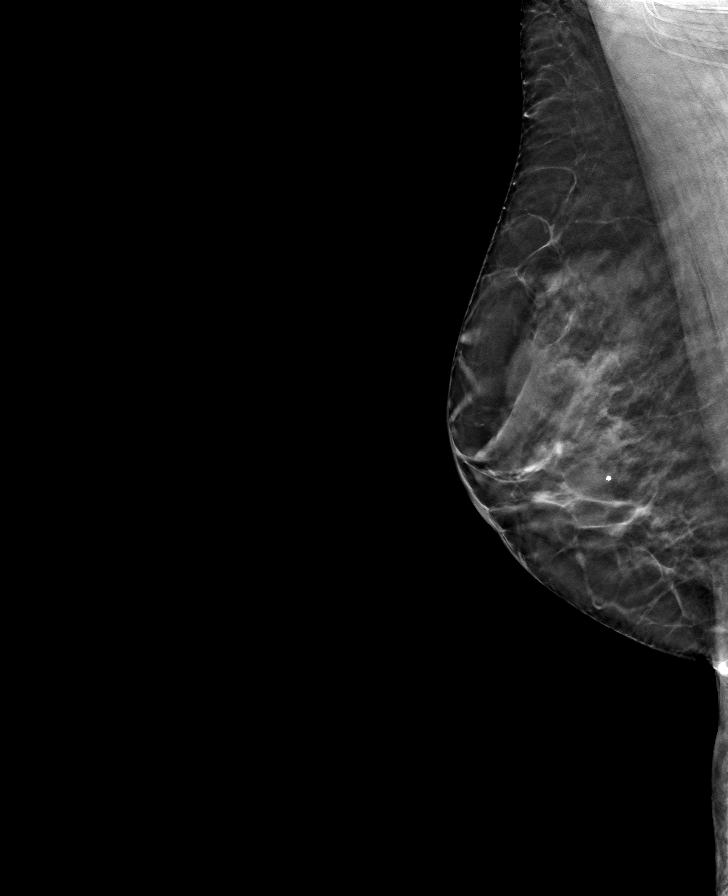

[L CC tomo · tomo slice 33/66.0]
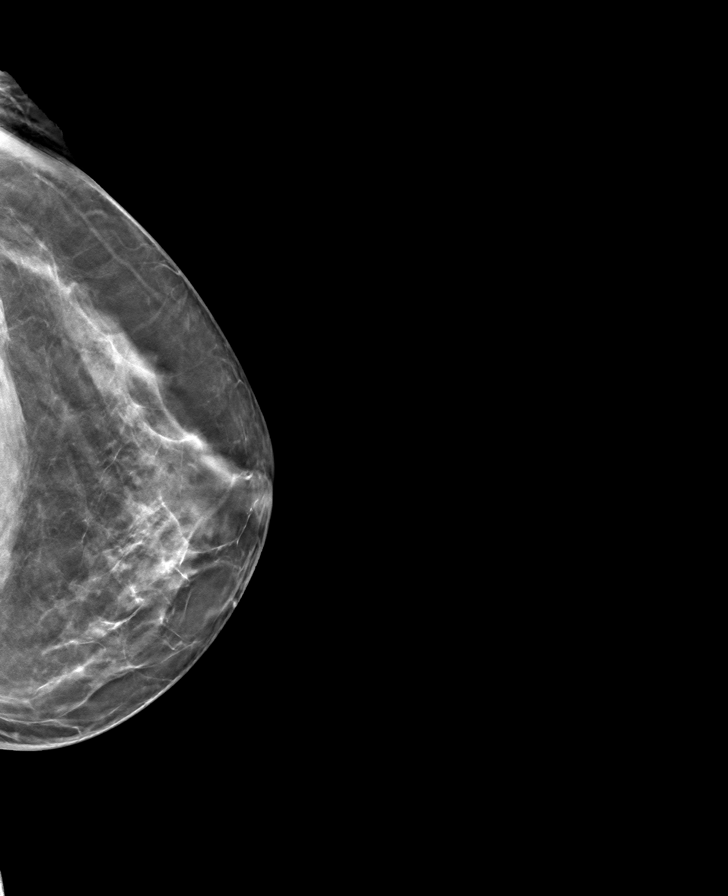

[L MLO tomo · tomo slice 33/64.0]
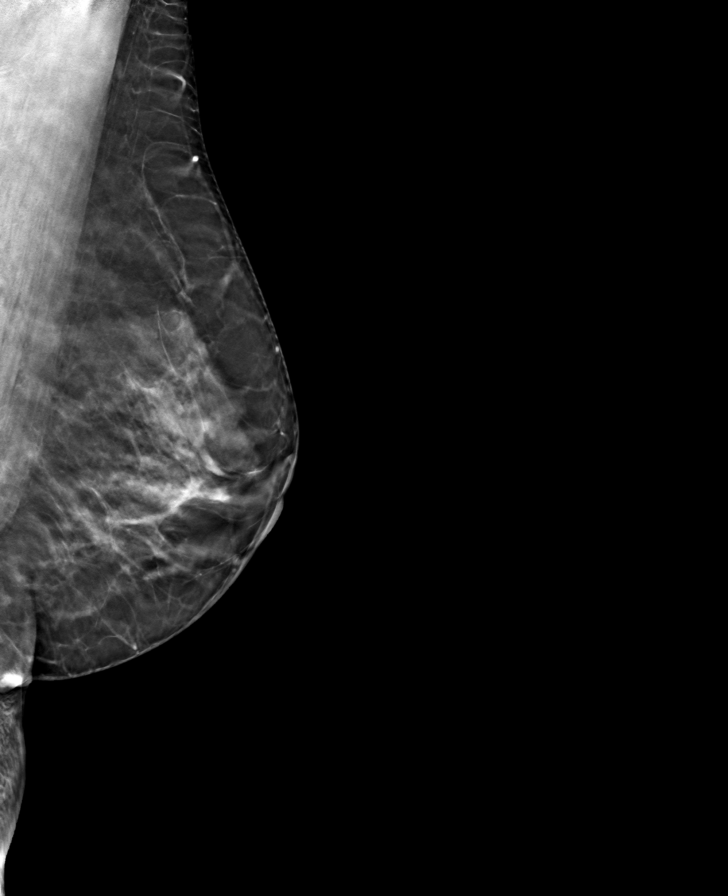

[8 of 24 positions shown; findings below may reference images not displayed]

ACR Breast Density Category c: The breast tissue is heterogeneously
dense, which may obscure small masses.
FINDINGS: There are no findings suspicious for malignancy.
IMPRESSION: No mammographic evidence of malignancy. A result letter of this
screening mammogram will be mailed directly to the patient.

RECOMMENDATION:
Screening mammogram in one year. (Code:Q3-W-BC3)

BI-RADS CATEGORY  1: Negative.

## 2023-09-13 DIAGNOSIS — Z0100 Encounter for examination of eyes and vision without abnormal findings: Secondary | ICD-10-CM | POA: Diagnosis not present

## 2023-09-13 DIAGNOSIS — H52223 Regular astigmatism, bilateral: Secondary | ICD-10-CM | POA: Diagnosis not present

## 2023-10-18 DIAGNOSIS — Z85828 Personal history of other malignant neoplasm of skin: Secondary | ICD-10-CM | POA: Diagnosis not present

## 2023-10-18 DIAGNOSIS — L570 Actinic keratosis: Secondary | ICD-10-CM | POA: Diagnosis not present

## 2023-10-18 DIAGNOSIS — L814 Other melanin hyperpigmentation: Secondary | ICD-10-CM | POA: Diagnosis not present

## 2023-10-18 DIAGNOSIS — D2261 Melanocytic nevi of right upper limb, including shoulder: Secondary | ICD-10-CM | POA: Diagnosis not present

## 2023-10-18 DIAGNOSIS — L819 Disorder of pigmentation, unspecified: Secondary | ICD-10-CM | POA: Diagnosis not present

## 2023-10-18 DIAGNOSIS — R208 Other disturbances of skin sensation: Secondary | ICD-10-CM | POA: Diagnosis not present

## 2023-10-18 DIAGNOSIS — R69 Illness, unspecified: Secondary | ICD-10-CM | POA: Diagnosis not present

## 2023-10-18 DIAGNOSIS — D1801 Hemangioma of skin and subcutaneous tissue: Secondary | ICD-10-CM | POA: Diagnosis not present

## 2023-10-18 DIAGNOSIS — L821 Other seborrheic keratosis: Secondary | ICD-10-CM | POA: Diagnosis not present

## 2023-11-07 DIAGNOSIS — Z136 Encounter for screening for cardiovascular disorders: Secondary | ICD-10-CM | POA: Diagnosis not present

## 2023-11-07 DIAGNOSIS — I10 Essential (primary) hypertension: Secondary | ICD-10-CM | POA: Diagnosis not present

## 2023-11-07 DIAGNOSIS — Z13220 Encounter for screening for lipoid disorders: Secondary | ICD-10-CM | POA: Diagnosis not present

## 2023-11-09 DIAGNOSIS — Z0000 Encounter for general adult medical examination without abnormal findings: Secondary | ICD-10-CM | POA: Diagnosis not present

## 2023-11-09 DIAGNOSIS — Z23 Encounter for immunization: Secondary | ICD-10-CM | POA: Diagnosis not present

## 2023-11-09 DIAGNOSIS — E785 Hyperlipidemia, unspecified: Secondary | ICD-10-CM | POA: Diagnosis not present

## 2023-11-09 DIAGNOSIS — I10 Essential (primary) hypertension: Secondary | ICD-10-CM | POA: Diagnosis not present

## 2023-11-10 DIAGNOSIS — R69 Illness, unspecified: Secondary | ICD-10-CM | POA: Diagnosis not present

## 2024-01-09 ENCOUNTER — Other Ambulatory Visit: Payer: Self-pay | Admitting: Family Medicine

## 2024-01-09 DIAGNOSIS — Z1231 Encounter for screening mammogram for malignant neoplasm of breast: Secondary | ICD-10-CM

## 2024-01-10 ENCOUNTER — Ambulatory Visit
Admission: RE | Admit: 2024-01-10 | Discharge: 2024-01-10 | Disposition: A | Discharge status: Home / Self Care | Payer: Medicare HMO | Source: Ambulatory Visit | Attending: Family Medicine | Admitting: Family Medicine

## 2024-01-10 DIAGNOSIS — Z1231 Encounter for screening mammogram for malignant neoplasm of breast: Secondary | ICD-10-CM | POA: Diagnosis not present

## 2024-02-20 DIAGNOSIS — Z01419 Encounter for gynecological examination (general) (routine) without abnormal findings: Secondary | ICD-10-CM | POA: Diagnosis not present

## 2024-02-20 DIAGNOSIS — N958 Other specified menopausal and perimenopausal disorders: Secondary | ICD-10-CM | POA: Diagnosis not present

## 2024-05-08 DIAGNOSIS — I10 Essential (primary) hypertension: Secondary | ICD-10-CM | POA: Diagnosis not present

## 2024-05-08 DIAGNOSIS — E785 Hyperlipidemia, unspecified: Secondary | ICD-10-CM | POA: Diagnosis not present

## 2024-05-09 DIAGNOSIS — E785 Hyperlipidemia, unspecified: Secondary | ICD-10-CM | POA: Diagnosis not present

## 2024-05-09 DIAGNOSIS — I10 Essential (primary) hypertension: Secondary | ICD-10-CM | POA: Diagnosis not present

## 2024-08-17 DIAGNOSIS — M5459 Other low back pain: Secondary | ICD-10-CM | POA: Diagnosis not present

## 2024-09-10 DIAGNOSIS — M5459 Other low back pain: Secondary | ICD-10-CM | POA: Diagnosis not present

## 2024-09-10 DIAGNOSIS — M25551 Pain in right hip: Secondary | ICD-10-CM | POA: Diagnosis not present

## 2024-12-19 ENCOUNTER — Other Ambulatory Visit (HOSPITAL_BASED_OUTPATIENT_CLINIC_OR_DEPARTMENT_OTHER): Payer: Self-pay | Admitting: Family Medicine

## 2024-12-19 DIAGNOSIS — E785 Hyperlipidemia, unspecified: Secondary | ICD-10-CM
